# Patient Record
Sex: Male | Born: 1940 | Race: Black or African American | Hispanic: No | Marital: Married | Smoking: Never smoker
Health system: Southern US, Community
[De-identification: ages and names within clinical notes are randomized; demographics above are authoritative.]

## PROBLEM LIST (undated history)

## (undated) DIAGNOSIS — I10 Essential (primary) hypertension: Secondary | ICD-10-CM

## (undated) DIAGNOSIS — T753XXA Motion sickness, initial encounter: Secondary | ICD-10-CM

## (undated) DIAGNOSIS — Z972 Presence of dental prosthetic device (complete) (partial): Secondary | ICD-10-CM

## (undated) DIAGNOSIS — E785 Hyperlipidemia, unspecified: Secondary | ICD-10-CM

## (undated) DIAGNOSIS — H409 Unspecified glaucoma: Secondary | ICD-10-CM

## (undated) HISTORY — PX: EYE SURGERY: SHX253

---

## 2006-07-17 ENCOUNTER — Ambulatory Visit: Payer: Self-pay | Admitting: Unknown Physician Specialty

## 2009-04-13 ENCOUNTER — Ambulatory Visit (HOSPITAL_COMMUNITY): Admission: RE | Admit: 2009-04-13 | Discharge: 2009-04-13 | Payer: Self-pay | Admitting: Ophthalmology

## 2009-06-21 ENCOUNTER — Ambulatory Visit (HOSPITAL_COMMUNITY): Admission: RE | Admit: 2009-06-21 | Discharge: 2009-06-21 | Payer: Self-pay | Admitting: Ophthalmology

## 2009-10-13 ENCOUNTER — Ambulatory Visit: Payer: Self-pay | Admitting: Unknown Physician Specialty

## 2010-05-25 IMAGING — CR DG CHEST 2V
2 series · 2 of 2 positions shown · non-contrast
Comparison: None

CLINICAL DATA: Preop evaluation for a fall, surgery, hypertension .

CHEST - 2 VIEW

[view not recorded (1 of 2)]
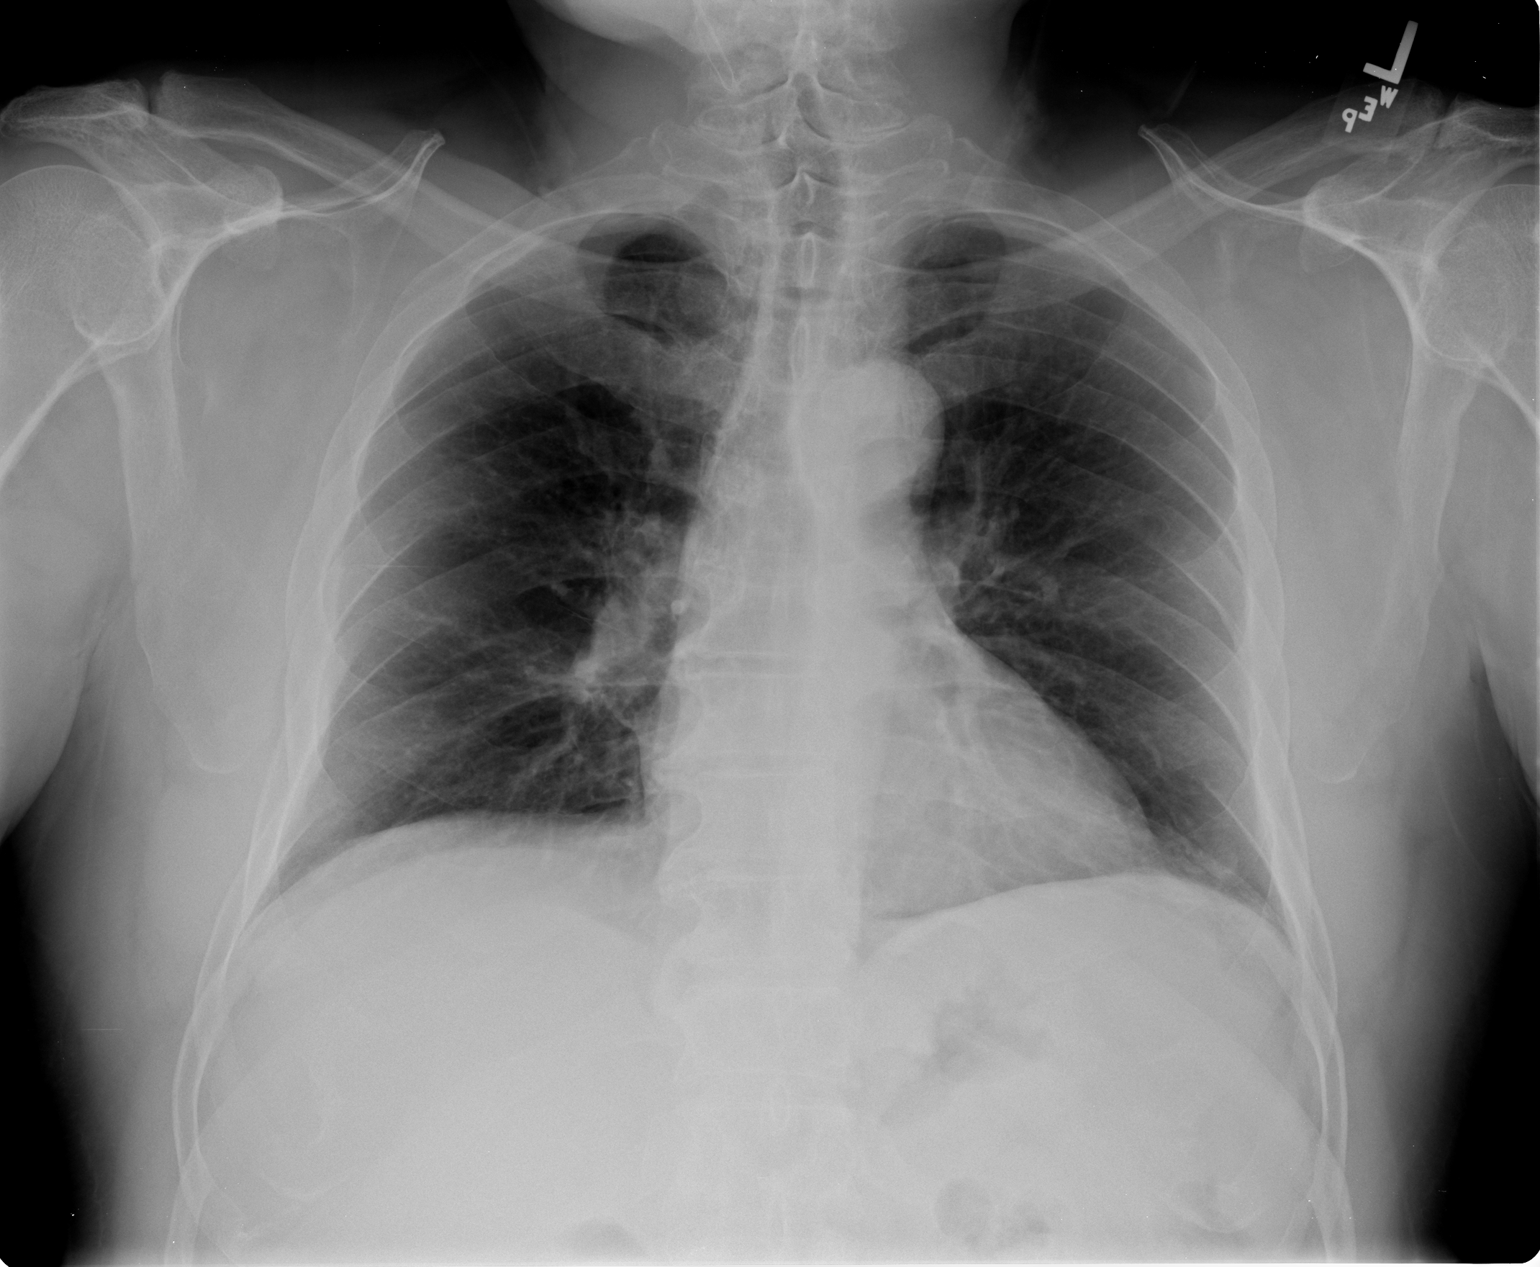

[view not recorded (2 of 2)]
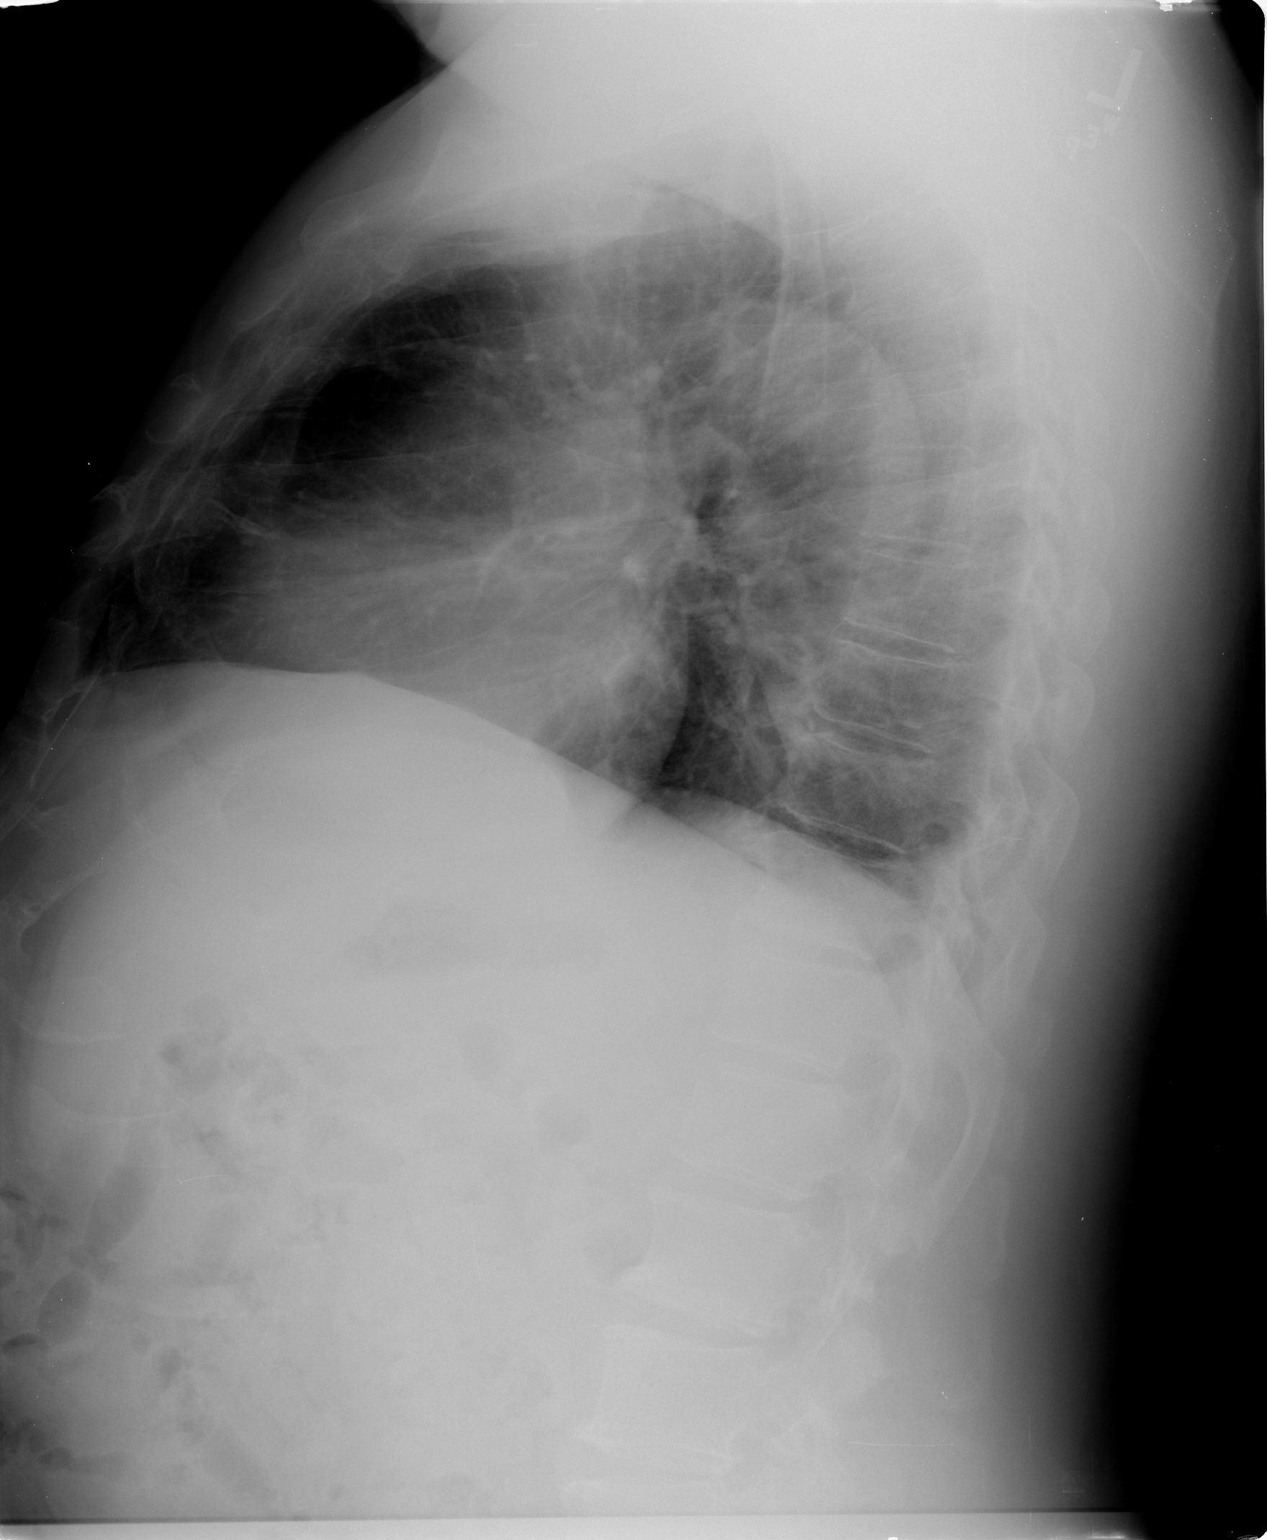

[2 of 2 positions shown; findings below may reference images not displayed]

FINDINGS: Low lung volumes with vascular crowding and basilar
atelectasis.  Negative for pneumonia, edema, effusion or
pneumothorax.  Degenerative thoracic spine.  Symmetric lower chest
nodular densities consistent with nipple shadows.
IMPRESSION: Low volume exam.  No active chest disease.

## 2010-08-21 LAB — BASIC METABOLIC PANEL
BUN: 9 mg/dL (ref 6–23)
CO2: 24 mEq/L (ref 19–32)
Creatinine, Ser: 1.19 mg/dL (ref 0.4–1.5)
GFR calc Af Amer: >60 mL/min (ref >60)
GFR calc non Af Amer: >60 mL/min (ref >60)
Glucose, Bld: 108 mg/dL — ABNORMAL HIGH (ref 70–99)
Sodium: 138 mEq/L (ref 135–145)

## 2010-08-21 LAB — CBC
MCV: 78.4 fL (ref 78.0–100.0)
WBC: 4.9 10*3/uL (ref 4.0–10.5)

## 2010-09-07 LAB — CBC
Hemoglobin: 14.2 g/dL (ref 13.0–17.0)
Platelets: 216 10*3/uL (ref 150–400)
RBC: 5.5 MIL/uL (ref 4.22–5.81)
WBC: 5.1 10*3/uL (ref 4.0–10.5)

## 2010-09-07 LAB — BASIC METABOLIC PANEL
BUN: 7 mg/dL (ref 6–23)
CO2: 26 mEq/L (ref 19–32)
Chloride: 105 mEq/L (ref 96–112)
GFR calc Af Amer: >60 mL/min (ref >60)
GFR calc non Af Amer: >60 mL/min (ref >60)
Glucose, Bld: 83 mg/dL (ref 70–99)
Potassium: 4.5 mEq/L (ref 3.5–5.1)
Sodium: 137 mEq/L (ref 135–145)

## 2012-09-02 ENCOUNTER — Ambulatory Visit: Payer: Self-pay | Admitting: Emergency Medicine

## 2012-09-02 LAB — CBC WITH DIFFERENTIAL/PLATELET
Basophil #: 0 10*3/uL (ref 0.0–0.1)
Basophil %: 0.4 %
Eosinophil #: 0 10*3/uL (ref 0.0–0.7)
Eosinophil %: 0 %
HCT: 52.9 % — ABNORMAL HIGH (ref 40.0–52.0)
HGB: 17.1 g/dL (ref 13.0–18.0)
Lymphocyte #: 0.2 10*3/uL — ABNORMAL LOW (ref 1.0–3.6)
Lymphocyte %: 1.9 %
MCH: 25.1 pg — ABNORMAL LOW (ref 26.0–34.0)
MCHC: 32.3 g/dL (ref 32.0–36.0)
MCV: 78 fL — ABNORMAL LOW (ref 80–100)
Monocyte #: 0.4 x10 3/mm (ref 0.2–1.0)
Monocyte %: 3.3 %
Neutrophil #: 10.5 10*3/uL — ABNORMAL HIGH (ref 1.4–6.5)
Neutrophil %: 94.4 %
Platelet: 249 10*3/uL (ref 150–440)
RBC: 6.82 10*6/uL — ABNORMAL HIGH (ref 4.40–5.90)
RDW: 15.6 % — ABNORMAL HIGH (ref 11.5–14.5)
WBC: 11.1 10*3/uL — ABNORMAL HIGH (ref 3.8–10.6)

## 2012-09-02 LAB — COMPREHENSIVE METABOLIC PANEL
Albumin: 4.4 g/dL (ref 3.4–5.0)
Alkaline Phosphatase: 91 U/L (ref 50–136)
Anion Gap: 13 (ref 7–16)
BUN: 25 mg/dL — ABNORMAL HIGH (ref 7–18)
Bilirubin,Total: 0.8 mg/dL (ref 0.2–1.0)
Calcium, Total: 10.5 mg/dL — ABNORMAL HIGH (ref 8.5–10.1)
Chloride: 104 mmol/L (ref 98–107)
Co2: 24 mmol/L (ref 21–32)
Creatinine: 3.09 mg/dL — ABNORMAL HIGH (ref 0.60–1.30)
EGFR (African American): 22 — ABNORMAL LOW
EGFR (Non-African Amer.): 19 — ABNORMAL LOW
Glucose: 142 mg/dL — ABNORMAL HIGH (ref 65–99)
Osmolality: 288 (ref 275–301)
Potassium: 4.8 mmol/L (ref 3.5–5.1)
SGOT(AST): 21 U/L (ref 15–37)
SGPT (ALT): 27 U/L (ref 12–78)
Sodium: 141 mmol/L (ref 136–145)
Total Protein: 10.1 g/dL — ABNORMAL HIGH (ref 6.4–8.2)

## 2013-05-13 ENCOUNTER — Ambulatory Visit: Payer: Self-pay | Admitting: Family Medicine

## 2013-11-10 ENCOUNTER — Ambulatory Visit: Payer: Self-pay | Admitting: Unknown Physician Specialty

## 2018-01-26 ENCOUNTER — Ambulatory Visit
Admission: EM | Admit: 2018-01-26 | Discharge: 2018-01-26 | Disposition: A | Discharge status: Home / Self Care | Payer: PRIVATE HEALTH INSURANCE | Attending: Family Medicine | Admitting: Family Medicine

## 2018-01-26 ENCOUNTER — Other Ambulatory Visit: Payer: Self-pay

## 2018-01-26 DIAGNOSIS — J029 Acute pharyngitis, unspecified: Secondary | ICD-10-CM | POA: Diagnosis not present

## 2018-01-26 HISTORY — DX: Essential (primary) hypertension: I10

## 2018-01-26 LAB — RAPID STREP SCREEN (MED CTR MEBANE ONLY): Streptococcus, Group A Screen (Direct): NEGATIVE

## 2018-01-26 MED ORDER — LIDOCAINE VISCOUS HCL 2 % MT SOLN: OROMUCOSAL | 0 refills | Status: AC

## 2018-01-26 NOTE — Discharge Instructions (Signed)
This is viral.  Tylenol as needed.  Use the viscous lidocaine as directed.   Take care  Dr. Lacinda Axon

## 2018-01-26 NOTE — ED Provider Notes (Signed)
[facility]    CSN: [id number] Arrival date & time: 01/26/18  0820  History   Chief Complaint Chief Complaint  Patient presents with  . Sore Throat   HPI   77 year old male presents with sore throat.  Sore throat started yesterday.  Patient denies any other upper respiratory symptoms.  Pain is mild to moderate, currently 5/10 in severity.  No known exacerbating factors. He has used some salt water gargles without improvement.  No other medications or interventions tried.  No reported sick contacts.  No other associated symptoms.  No other claims.   PMH, Surgical Hx, Family Hx, Social History reviewed and updated as below.  Past Medical History:  Diagnosis Date  . Hypertension    Past Surgical History:  Procedure Laterality Date  . EYE SURGERY     Home Medications    Prior to Admission medications   Medication Sig Start Date End Date Taking? Authorizing Provider  amLODipine (NORVASC) 10 MG tablet Take by mouth. 11/05/17  Yes [provider]  lisinopril (PRINIVIL,ZESTRIL) 10 MG tablet Take by mouth. 11/05/17  Yes [provider]  aspirin EC 81 MG tablet Take by mouth.    [provider]  Cyanocobalamin (VITAMIN B-12) 1000 MCG SUBL Take by mouth.    [provider]  lidocaine (XYLOCAINE) 2 % solution Gargle 15 mL every 3 hours as needed. May swallow if desired. 01/26/18   Coral Spikes, DO    Family History Diabetes type II Brother    Myocardial Infarction (Heart attack) Father    Diabetes Mother    High blood pressure (Hypertension) Mother    Myocardial Infarction (Heart attack) Mother    Diabetes type II Sister     Social History Social History   Tobacco Use  . Smoking status: Never Smoker  . Smokeless tobacco: Never Used  Substance Use Topics  . Alcohol use: Not Currently  . Drug use: Not Currently   Allergies   Patient has no known allergies.   Review of Systems Review of Systems  Constitutional:  Negative.   HENT: Positive for sore throat.    Physical Exam Triage Vital Signs ED Triage Vitals  Enc Vitals Group     BP 01/26/18 0837 137/89     Pulse Rate 01/26/18 0837 69     Resp 01/26/18 0837 18     Temp 01/26/18 0837 98.4 F (36.9 C)     Temp Source 01/26/18 0837 Oral     SpO2 01/26/18 0837 99 %     Weight 01/26/18 0833 223 lb (101.2 kg)     Height 01/26/18 0833 5\' 9"  (1.753 m)     Head Circumference --      Peak Flow --      Pain Score 01/26/18 0833 5     Pain Loc --      Pain Edu? --      Excl. in [place]? --    Updated Vital Signs BP 137/89 (BP Location: Right Arm)   Pulse 69   Temp 98.4 F (36.9 C) (Oral)   Resp 18   Ht 5\' 9"  (1.753 m)   Wt 101.2 kg   SpO2 99%   BMI 32.93 kg/m    Visual Acuity Right Eye Distance:   Left Eye Distance:   Bilateral Distance:    Right Eye Near:   Left Eye Near:    Bilateral Near:     Physical Exam  Constitutional: He is oriented to person, place, and time.  He appears well-developed. No distress.  HENT:  Head: Normocephalic and atraumatic.  Oropharynx with mild to moderate erythema.  No exudate.  Cardiovascular: Normal rate and regular rhythm.  Pulmonary/Chest: Effort normal and breath sounds normal. He has no wheezes. He has no rales.  Neurological: He is alert and oriented to person, place, and time.  Psychiatric: He has a normal mood and affect. His behavior is normal.  Nursing note and vitals reviewed.  UC Treatments / Results  Labs (all labs ordered are listed, but only abnormal results are displayed) Labs Reviewed  RAPID STREP SCREEN ([facility] ONLY)  CULTURE, GROUP A STREP [facility])   EKG None  Radiology No results found.  Procedures Procedures (including critical care time)  Medications Ordered in UC Medications - No data to display  Initial Impression / Assessment and Plan / UC Course  I have reviewed the triage vital signs and the nursing notes.  Pertinent labs & imaging results that were  available during my care of the patient were reviewed by me and considered in my medical decision making (see chart for details).    77 year old male presents with sore throat.  Rapid strep negative.  Likely viral in origin.  Viscous lidocaine as needed.  Tylenol as needed.  Supportive care.  Final Clinical Impressions(s) / UC Diagnoses   Final diagnoses:  Viral pharyngitis     Discharge Instructions     This is viral.  Tylenol as needed.  Use the viscous lidocaine as directed.   Take care  Dr. Lacinda Axon    ED Prescriptions    Medication Sig Dispense Auth. Provider   lidocaine (XYLOCAINE) 2 % solution Gargle 15 mL every 3 hours as needed. May swallow if desired. 200 mL Coral Spikes, DO     Controlled Substance Prescriptions [place] Controlled Substance Registry consulted? Not Applicable  Coral Spikes, [place] 01/26/18 0350

## 2018-01-26 NOTE — ED Triage Notes (Signed)
Pt with sore throat starting yesterday. Denies other sx. Pain 5/10

## 2018-01-29 LAB — CULTURE, GROUP A STREP (THRC)

## 2018-09-23 ENCOUNTER — Encounter: Admission: RE | Payer: Self-pay | Source: Home / Self Care

## 2018-09-23 ENCOUNTER — Ambulatory Visit: Admission: RE | Admit: 2018-09-23 | Payer: Medicare Other | Source: Home / Self Care | Admitting: Ophthalmology

## 2018-09-23 SURGERY — PHACOEMULSIFICATION, CATARACT, WITH IOL INSERTION
Anesthesia: Topical | Laterality: Right

## 2018-11-04 ENCOUNTER — Other Ambulatory Visit: Payer: Self-pay

## 2018-11-04 ENCOUNTER — Encounter: Payer: Self-pay | Admitting: *Deleted

## 2018-11-07 ENCOUNTER — Other Ambulatory Visit: Payer: Self-pay

## 2018-11-07 ENCOUNTER — Other Ambulatory Visit
Admission: RE | Admit: 2018-11-07 | Discharge: 2018-11-07 | Disposition: A | Discharge status: Home / Self Care | Payer: Medicare Other | Source: Ambulatory Visit | Attending: Ophthalmology | Admitting: Ophthalmology

## 2018-11-07 DIAGNOSIS — Z1159 Encounter for screening for other viral diseases: Secondary | ICD-10-CM | POA: Diagnosis present

## 2018-11-08 LAB — NOVEL CORONAVIRUS, NAA (HOSP ORDER, SEND-OUT TO REF LAB; TAT 18-24 HRS): SARS-CoV-2, NAA: NOT DETECTED

## 2018-11-08 NOTE — Discharge Instructions (Signed)

## 2018-11-11 ENCOUNTER — Ambulatory Visit: Payer: Medicare Other | Admitting: Anesthesiology

## 2018-11-11 ENCOUNTER — Ambulatory Visit
Admission: RE | Admit: 2018-11-11 | Discharge: 2018-11-11 | Disposition: A | Discharge status: Home / Self Care | Payer: Medicare Other | Attending: Ophthalmology | Admitting: Ophthalmology

## 2018-11-11 ENCOUNTER — Encounter
Admission: RE | Disposition: A | Discharge status: Home / Self Care | Payer: Self-pay | Source: Home / Self Care | Attending: Ophthalmology

## 2018-11-11 DIAGNOSIS — I10 Essential (primary) hypertension: Secondary | ICD-10-CM | POA: Diagnosis not present

## 2018-11-11 DIAGNOSIS — H2511 Age-related nuclear cataract, right eye: Secondary | ICD-10-CM | POA: Diagnosis not present

## 2018-11-11 DIAGNOSIS — Z79899 Other long term (current) drug therapy: Secondary | ICD-10-CM | POA: Diagnosis not present

## 2018-11-11 DIAGNOSIS — Z7982 Long term (current) use of aspirin: Secondary | ICD-10-CM | POA: Insufficient documentation

## 2018-11-11 HISTORY — DX: Presence of dental prosthetic device (complete) (partial): Z97.2

## 2018-11-11 HISTORY — DX: Motion sickness, initial encounter: T75.3XXA

## 2018-11-11 HISTORY — PX: CATARACT EXTRACTION W/PHACO: SHX586

## 2018-11-11 SURGERY — PHACOEMULSIFICATION, CATARACT, WITH IOL INSERTION
Anesthesia: General | Site: Eye | Laterality: Right

## 2018-11-11 MED ORDER — MIDAZOLAM HCL 2 MG/2ML IJ SOLN
INTRAMUSCULAR | Status: DC | PRN
  Administered 2018-11-11: 2 mg via INTRAVENOUS

## 2018-11-11 MED ORDER — LIDOCAINE HCL (PF) 2 % IJ SOLN
INTRAOCULAR | Status: DC | PRN
  Administered 2018-11-11: 12:00:00 1 mL via INTRAOCULAR

## 2018-11-11 MED ORDER — MOXIFLOXACIN HCL 0.5 % OP SOLN
OPHTHALMIC | Status: DC | PRN
  Administered 2018-11-11: 0.2 mL via OPHTHALMIC

## 2018-11-11 MED ORDER — DIFLUPREDNATE 0.05 % OP EMUL: 1.0000 [drp] | Freq: Four times a day (QID) | OPHTHALMIC | 0 refills | Status: AC

## 2018-11-11 MED ORDER — LACTATED RINGERS IV SOLN: 10.0000 mL/h | INTRAVENOUS | Status: DC

## 2018-11-11 MED ORDER — TETRACAINE HCL 0.5 % OP SOLN
1.0000 [drp] | OPHTHALMIC | Status: DC | PRN
  Administered 2018-11-11 (×3): 1 [drp] via OPHTHALMIC

## 2018-11-11 MED ORDER — ARMC OPHTHALMIC DILATING DROPS
1.0000 "application " | OPHTHALMIC | Status: DC | PRN
  Administered 2018-11-11 (×3): 1 via OPHTHALMIC

## 2018-11-11 MED ORDER — EPINEPHRINE PF 1 MG/ML IJ SOLN
INTRAOCULAR | Status: DC | PRN
  Administered 2018-11-11: 79 mL via OPHTHALMIC

## 2018-11-11 MED ORDER — SODIUM HYALURONATE 10 MG/ML IO SOLN
INTRAOCULAR | Status: DC | PRN
  Administered 2018-11-11: 0.55 mL via INTRAOCULAR

## 2018-11-11 MED ORDER — SODIUM HYALURONATE 23 MG/ML IO SOLN
INTRAOCULAR | Status: DC | PRN
  Administered 2018-11-11: 0.6 mL via INTRAOCULAR

## 2018-11-11 MED ORDER — ONDANSETRON HCL 4 MG/2ML IJ SOLN: 4.0000 mg | Freq: Once | INTRAMUSCULAR | Status: DC | PRN

## 2018-11-11 MED ORDER — FENTANYL CITRATE (PF) 100 MCG/2ML IJ SOLN
INTRAMUSCULAR | Status: DC | PRN
  Administered 2018-11-11: 50 ug via INTRAVENOUS

## 2018-11-11 SURGICAL SUPPLY — 17 items
CANNULA ANT/CHMB 27GA (MISCELLANEOUS) ×4 IMPLANT
DISSECTOR HYDRO NUCLEUS 50X22 (MISCELLANEOUS) ×2 IMPLANT
GLOVE SURG LX 7.5 STRW (GLOVE) ×2
GLOVE SURG LX STRL 7.5 STRW (GLOVE) ×2 IMPLANT
GLOVE SURG SYN 8.5  E (GLOVE) ×1
GLOVE SURG SYN 8.5 E (GLOVE) ×1 IMPLANT
GOWN STRL REUS W/ TWL LRG LVL3 (GOWN DISPOSABLE) ×2 IMPLANT
GOWN STRL REUS W/TWL LRG LVL3 (GOWN DISPOSABLE) ×2
LENS IOL TECNIS ITEC 17.5 (Intraocular Lens) ×2 IMPLANT
MARKER SKIN DUAL TIP RULER LAB (MISCELLANEOUS) ×2 IMPLANT
PACK DR. KING ARMS (PACKS) ×2 IMPLANT
PACK EYE AFTER SURG (MISCELLANEOUS) ×2 IMPLANT
PACK OPTHALMIC (MISCELLANEOUS) ×2 IMPLANT
SYR 3ML LL SCALE MARK (SYRINGE) ×2 IMPLANT
SYR TB 1ML LUER SLIP (SYRINGE) ×2 IMPLANT
WATER STERILE IRR 250ML POUR (IV SOLUTION) ×2 IMPLANT
WIPE NON LINTING 3.25X3.25 (MISCELLANEOUS) ×2 IMPLANT

## 2018-11-11 NOTE — Anesthesia Postprocedure Evaluation (Signed)
Anesthesia Post Note  Patient: Rickey Mitchell  Procedure(s) Performed: CATARACT EXTRACTION PHACO AND INTRAOCULAR LENS PLACEMENT (IOC)  RIGHT (Right Eye)  Patient location during evaluation: PACU Anesthesia Type: MAC Level of consciousness: awake and alert Pain management: pain level controlled Vital Signs Assessment: post-procedure vital signs reviewed and stable Respiratory status: spontaneous breathing, nonlabored ventilation, respiratory function stable and patient connected to nasal cannula oxygen Cardiovascular status: stable and blood pressure returned to baseline Postop Assessment: no apparent nausea or vomiting Anesthetic complications: no    SCOURAS, NICOLE ELAINE

## 2018-11-11 NOTE — Op Note (Signed)
OPERATIVE NOTE  Rickey Mitchell [id number] 11/11/2018   PREOPERATIVE DIAGNOSIS:  Nuclear sclerotic cataract right eye.  H25.11   POSTOPERATIVE DIAGNOSIS:    Nuclear sclerotic cataract right eye.     PROCEDURE:  Phacoemusification with posterior chamber intraocular lens placement of the right eye   LENS:   Implant Name Type Inv. Item Serial No. Manufacturer Lot No. LRB No. Used  LENS IOL DIOP 17.5 - [id number] Intraocular Lens LENS IOL DIOP 17.5 [id number] AMO  Right 1       PCB00 +17.5   ULTRASOUND TIME: 1 minutes 13 seconds.  CDE 7.14   SURGEON:  Benay Pillow, MD, MPH  ANESTHESIOLOGIST: Anesthesiologist: Marice Potter, MD CRNA: Janna Arch, CRNA   ANESTHESIA:  Topical with tetracaine drops augmented with 1% preservative-free intracameral lidocaine.  ESTIMATED BLOOD LOSS: less than 1 mL.   COMPLICATIONS:  None.   DESCRIPTION OF PROCEDURE:  The patient was identified in the holding room and transported to the operating room and placed in the supine position under the operating microscope.  The right eye was identified as the operative eye and it was prepped and draped in the usual sterile ophthalmic fashion.   A 1.0 millimeter clear-corneal paracentesis was made at the 10:30 position. 0.5 ml of preservative-free 1% lidocaine with epinephrine was injected into the anterior chamber.  The anterior chamber was filled with Healon 5 viscoelastic.  A 2.4 millimeter keratome was used to make a near-clear corneal incision at the 8:00 position.  A curvilinear capsulorrhexis was made with a cystotome and capsulorrhexis forceps.  Balanced salt solution was used to hydrodissect and hydrodelineate the nucleus.   Phacoemulsification was then used in stop and chop fashion to remove the lens nucleus and epinucleus.  The remaining cortex was then removed using the irrigation and aspiration handpiece. Healon was then placed into the capsular bag to distend it for lens placement.  A  lens was then injected into the capsular bag.  The remaining viscoelastic was aspirated.   Wounds were hydrated with balanced salt solution.  The anterior chamber was inflated to a physiologic pressure with balanced salt solution.   Intracameral vigamox 0.1 mL undiluted was injected into the eye and a drop placed onto the ocular surface.  No wound leaks were noted.  The patient was taken to the recovery room in stable condition without complications of anesthesia or surgery  Benay Pillow 11/11/2018, 12:20 PM

## 2018-11-11 NOTE — Transfer of Care (Signed)
Immediate Anesthesia Transfer of Care Note  Patient: Rickey Mitchell  Procedure(s) Performed: CATARACT EXTRACTION PHACO AND INTRAOCULAR LENS PLACEMENT (IOC)  RIGHT (Right Eye)  Patient Location: PACU  Anesthesia Type: General  Level of Consciousness: awake, alert  and patient cooperative  Airway and Oxygen Therapy: Patient Spontanous Breathing and Patient connected to supplemental oxygen  Post-op Assessment: Post-op Vital signs reviewed, Patient's Cardiovascular Status Stable, Respiratory Function Stable, Patent Airway and No signs of Nausea or vomiting  Post-op Vital Signs: Reviewed and stable  Complications: No apparent anesthesia complications

## 2018-11-11 NOTE — H&P (Signed)

## 2018-11-11 NOTE — Anesthesia Preprocedure Evaluation (Addendum)
Anesthesia Evaluation  Patient identified by MRN, date of birth, ID band Patient awake    Reviewed: Allergy & Precautions, H&P , NPO status , Patient's Chart, lab work & pertinent test results, reviewed documented beta blocker date and time   Airway Mallampati: II  TM Distance: >3 FB Neck ROM: full    Dental no notable dental hx. (+) Upper Dentures, Lower Dentures   Pulmonary neg pulmonary ROS,    Pulmonary exam normal breath sounds clear to auscultation       Cardiovascular Exercise Tolerance: Good hypertension,  Rhythm:regular Rate:Normal     Neuro/Psych negative neurological ROS  negative psych ROS   GI/Hepatic negative GI ROS, Neg liver ROS,   Endo/Other  negative endocrine ROS  Renal/GU negative Renal ROS  negative genitourinary   Musculoskeletal   Abdominal   Peds  Hematology negative hematology ROS (+)   Anesthesia Other Findings   Reproductive/Obstetrics negative OB ROS                            Anesthesia Physical Anesthesia Plan  ASA: II  Anesthesia Plan: MAC   Post-op Pain Management:    Induction:   PONV Risk Score and Plan:   Airway Management Planned:   Additional Equipment:   Intra-op Plan:   Post-operative Plan:   Informed Consent: I have reviewed the patients History and Physical, chart, labs and discussed the procedure including the risks, benefits and alternatives for the proposed anesthesia with the patient or authorized representative who has indicated his/her understanding and acceptance.     Dental Advisory Given  Plan Discussed with: CRNA  Anesthesia Plan Comments:        Anesthesia Quick Evaluation

## 2018-11-11 NOTE — Anesthesia Procedure Notes (Signed)
Procedure Name: MAC Date/Time: 11/11/2018 11:57 AM Performed by: Janna Arch, CRNA Pre-anesthesia Checklist: Patient identified, Emergency Drugs available, Suction available, Timeout performed and Patient being monitored Patient Re-evaluated:Patient Re-evaluated prior to induction Oxygen Delivery Method: Nasal cannula Placement Confirmation: positive ETCO2

## 2018-11-12 ENCOUNTER — Encounter: Payer: Self-pay | Admitting: Ophthalmology

## 2019-03-31 ENCOUNTER — Other Ambulatory Visit: Payer: Self-pay

## 2019-03-31 DIAGNOSIS — N400 Enlarged prostate without lower urinary tract symptoms: Secondary | ICD-10-CM

## 2019-04-01 ENCOUNTER — Other Ambulatory Visit
Admission: RE | Admit: 2019-04-01 | Discharge: 2019-04-01 | Disposition: A | Discharge status: Home / Self Care | Payer: Medicare Other | Attending: Urology | Admitting: Urology

## 2019-04-01 ENCOUNTER — Encounter: Payer: Self-pay | Admitting: Urology

## 2019-04-01 ENCOUNTER — Ambulatory Visit (INDEPENDENT_AMBULATORY_CARE_PROVIDER_SITE_OTHER): Payer: Medicare Other | Admitting: Urology

## 2019-04-01 ENCOUNTER — Other Ambulatory Visit: Payer: Self-pay

## 2019-04-01 DIAGNOSIS — N400 Enlarged prostate without lower urinary tract symptoms: Secondary | ICD-10-CM | POA: Diagnosis present

## 2019-04-01 LAB — URINALYSIS, COMPLETE (UACMP) WITH MICROSCOPIC
Bacteria, UA: NONE SEEN
Glucose, UA: NEGATIVE mg/dL
Hgb urine dipstick: NEGATIVE
Leukocytes,Ua: NEGATIVE
Nitrite: NEGATIVE
Protein, ur: NEGATIVE mg/dL
Specific Gravity, Urine: 1.025 (ref 1.005–1.030)
Squamous Epithelial / LPF: NONE SEEN (ref 0–5)
pH: 5.5 (ref 5.0–8.0)

## 2019-04-01 NOTE — Progress Notes (Signed)
C/x appt seeing another Urologist

## 2019-04-04 ENCOUNTER — Ambulatory Visit: Payer: Self-pay | Admitting: Urology

## 2020-08-27 ENCOUNTER — Other Ambulatory Visit
Admission: RE | Admit: 2020-08-27 | Discharge: 2020-08-27 | Disposition: A | Discharge status: Home / Self Care | Payer: Medicare Other | Source: Ambulatory Visit | Attending: Gastroenterology | Admitting: Gastroenterology

## 2020-08-27 ENCOUNTER — Other Ambulatory Visit: Payer: Self-pay

## 2020-08-27 DIAGNOSIS — Z20822 Contact with and (suspected) exposure to covid-19: Secondary | ICD-10-CM | POA: Insufficient documentation

## 2020-08-27 DIAGNOSIS — Z01812 Encounter for preprocedural laboratory examination: Secondary | ICD-10-CM | POA: Diagnosis present

## 2020-08-28 LAB — SARS CORONAVIRUS 2 (TAT 6-24 HRS): SARS Coronavirus 2: NEGATIVE

## 2020-08-30 ENCOUNTER — Encounter: Payer: Self-pay | Admitting: *Deleted

## 2020-08-31 ENCOUNTER — Other Ambulatory Visit: Payer: Self-pay

## 2020-08-31 ENCOUNTER — Encounter
Admission: RE | Disposition: A | Discharge status: Home / Self Care | Payer: Self-pay | Source: Home / Self Care | Attending: Gastroenterology

## 2020-08-31 ENCOUNTER — Ambulatory Visit: Payer: Medicare Other | Admitting: Anesthesiology

## 2020-08-31 ENCOUNTER — Ambulatory Visit
Admission: RE | Admit: 2020-08-31 | Discharge: 2020-08-31 | Disposition: A | Discharge status: Home / Self Care | Payer: Medicare Other | Attending: Gastroenterology | Admitting: Gastroenterology

## 2020-08-31 ENCOUNTER — Encounter: Payer: Self-pay | Admitting: *Deleted

## 2020-08-31 DIAGNOSIS — Z1211 Encounter for screening for malignant neoplasm of colon: Secondary | ICD-10-CM | POA: Diagnosis not present

## 2020-08-31 DIAGNOSIS — D128 Benign neoplasm of rectum: Secondary | ICD-10-CM | POA: Diagnosis not present

## 2020-08-31 DIAGNOSIS — K5730 Diverticulosis of large intestine without perforation or abscess without bleeding: Secondary | ICD-10-CM | POA: Insufficient documentation

## 2020-08-31 DIAGNOSIS — Z7982 Long term (current) use of aspirin: Secondary | ICD-10-CM | POA: Insufficient documentation

## 2020-08-31 DIAGNOSIS — Z86010 Personal history of colonic polyps: Secondary | ICD-10-CM | POA: Diagnosis not present

## 2020-08-31 DIAGNOSIS — Z79899 Other long term (current) drug therapy: Secondary | ICD-10-CM | POA: Diagnosis not present

## 2020-08-31 HISTORY — PX: COLONOSCOPY: SHX5424

## 2020-08-31 HISTORY — DX: Unspecified glaucoma: H40.9

## 2020-08-31 HISTORY — DX: Hyperlipidemia, unspecified: E78.5

## 2020-08-31 SURGERY — COLONOSCOPY
Anesthesia: General

## 2020-08-31 MED ORDER — SODIUM CHLORIDE 0.9 % IV SOLN: INTRAVENOUS | Status: DC

## 2020-08-31 MED ORDER — SODIUM CHLORIDE 0.9 % IV SOLN: INTRAVENOUS | Status: DC | PRN

## 2020-08-31 MED ORDER — PROPOFOL 500 MG/50ML IV EMUL
INTRAVENOUS | Status: DC | PRN
  Administered 2020-08-31: 200 ug/kg/min via INTRAVENOUS

## 2020-08-31 MED ORDER — PHENYLEPHRINE HCL (PRESSORS) 10 MG/ML IV SOLN
INTRAVENOUS | Status: DC | PRN
  Administered 2020-08-31: 200 ug via INTRAVENOUS

## 2020-08-31 MED ORDER — PROPOFOL 10 MG/ML IV BOLUS
INTRAVENOUS | Status: DC | PRN
  Administered 2020-08-31: 60 mg via INTRAVENOUS

## 2020-08-31 NOTE — Anesthesia Preprocedure Evaluation (Signed)
Anesthesia Evaluation  Patient identified by MRN, date of birth, ID band Patient awake    Reviewed: Allergy & Precautions, H&P , NPO status , Patient's Chart, lab work & pertinent test results, reviewed documented beta blocker date and time   Airway Mallampati: II   Neck ROM: full    Dental  (+) Teeth Intact   Pulmonary neg pulmonary ROS,    Pulmonary exam normal        Cardiovascular Exercise Tolerance: Poor hypertension, On Medications negative cardio ROS Normal cardiovascular exam Rhythm:regular Rate:Normal     Neuro/Psych negative neurological ROS  negative psych ROS   GI/Hepatic negative GI ROS, Neg liver ROS,   Endo/Other  negative endocrine ROS  Renal/GU negative Renal ROS  negative genitourinary   Musculoskeletal   Abdominal   Peds  Hematology negative hematology ROS (+)   Anesthesia Other Findings Past Medical History: No date: Glaucoma No date: Hyperlipidemia No date: Hypertension No date: Motion sickness     Comment:  deep sea fishing No date: Wears dentures     Comment:  partial upper and lower.  has, doesn't wear Past Surgical History: 11/11/2018: CATARACT EXTRACTION W/PHACO; Right     Comment:  Procedure: CATARACT EXTRACTION PHACO AND INTRAOCULAR               LENS PLACEMENT (IOC)  RIGHT;  Surgeon: Eulogio Bear, MD;  Location: [facility];  Service:               Ophthalmology;  Laterality: Right; No date: EYE SURGERY   Reproductive/Obstetrics negative OB ROS                             Anesthesia Physical Anesthesia Plan  ASA: II  Anesthesia Plan: General   Post-op Pain Management:    Induction:   PONV Risk Score and Plan:   Airway Management Planned:   Additional Equipment:   Intra-op Plan:   Post-operative Plan:   Informed Consent: I have reviewed the patients History and Physical, chart, labs and discussed the  procedure including the risks, benefits and alternatives for the proposed anesthesia with the patient or authorized representative who has indicated his/her understanding and acceptance.     Dental Advisory Given  Plan Discussed with: CRNA  Anesthesia Plan Comments:         Anesthesia Quick Evaluation

## 2020-08-31 NOTE — H&P (Signed)
Outpatient short stay form Pre-procedure 08/31/2020 9:01 AM Raylene Miyamoto MD, MPH  Primary Physician: [facility]  Reason for visit:  Surveillance  History of present illness:   80 y/o gentleman with history of hypertension here for surveillance colonoscopy. No family history of GI malignancies. No blood thinners. Hx of bladder removal. No significant GI symptoms.    Current Facility-Administered Medications:  .  0.9 %  sodium chloride infusion, , Intravenous, Continuous, Chelsey Kimberley, Hilton Cork, MD  Medications Prior to Admission  Medication Sig Dispense Refill Last Dose  . amLODipine (NORVASC) 10 MG tablet Take by mouth.   08/31/2020 at Unknown time  . aspirin EC 81 MG tablet Take by mouth.   Past Week at Unknown time  . Cyanocobalamin (VITAMIN B-12) 1000 MCG SUBL Take by mouth.   Past Week at Unknown time  . dorzolamide-timolol (COSOPT) 22.3-6.8 MG/ML ophthalmic solution 1 drop 2 (two) times daily.   08/31/2020 at Unknown time  . latanoprost (XALATAN) 0.005 % ophthalmic solution 1 drop at bedtime.   08/31/2020 at Unknown time  . lisinopril (PRINIVIL,ZESTRIL) 10 MG tablet Take by mouth.   08/31/2020 at Unknown time  . omeprazole (PRILOSEC) 20 MG capsule Take 20 mg by mouth daily.   Past Week at Unknown time  . rosuvastatin (CRESTOR) 5 MG tablet Take by mouth.   Past Week at Unknown time  . lidocaine (XYLOCAINE) 2 % solution Gargle 15 mL every 3 hours as needed. May swallow if desired. 200 mL 0      No Known Allergies   Past Medical History:  Diagnosis Date  . Glaucoma   . Hyperlipidemia   . Hypertension   . Motion sickness    deep sea fishing  . Wears dentures    partial upper and lower.  has, doesn't wear    Review of systems:  Otherwise negative.    Physical Exam  Gen: Alert, oriented. Appears stated age.  HEENT: PERRLA. Lungs: No respiratory distress CV: RRR Abd: soft, benign, no masses Ext: No edema    Planned procedures: Proceed with colonoscopy.  The patient understands the nature of the planned procedure, indications, risks, alternatives and potential complications including but not limited to bleeding, infection, perforation, damage to internal organs and possible oversedation/side effects from anesthesia. The patient agrees and gives consent to proceed.  Please refer to procedure notes for findings, recommendations and patient disposition/instructions.     Raylene Miyamoto MD, MPH Gastroenterology 08/31/2020  9:01 AM

## 2020-08-31 NOTE — Anesthesia Postprocedure Evaluation (Signed)
Anesthesia Post Note  Patient: Rickey Mitchell  Procedure(s) Performed: COLONOSCOPY (N/A )  Patient location during evaluation: PACU Anesthesia Type: General Level of consciousness: awake and alert Pain management: pain level controlled Vital Signs Assessment: post-procedure vital signs reviewed and stable Respiratory status: spontaneous breathing, nonlabored ventilation, respiratory function stable and patient connected to nasal cannula oxygen Cardiovascular status: blood pressure returned to baseline and stable Postop Assessment: no apparent nausea or vomiting Anesthetic complications: no   No complications documented.   Last Vitals:  Vitals:   08/31/20 0953 08/31/20 1003  BP: (!) 99/58 108/84  Pulse: 61 (!) 57  Resp: (!) 25 15  Temp:    SpO2: 100% 99%    Last Pain:  Vitals:   08/31/20 1003  TempSrc:   PainSc: 0-No pain                 Molli Barrows

## 2020-08-31 NOTE — Anesthesia Procedure Notes (Signed)
Date/Time: 08/31/2020 9:25 AM Performed by: Nelda Marseille, CRNA Pre-anesthesia Checklist: Patient identified, Emergency Drugs available, Suction available, Patient being monitored and Timeout performed Oxygen Delivery Method: Simple face mask

## 2020-08-31 NOTE — Transfer of Care (Signed)
Immediate Anesthesia Transfer of Care Note  Patient: Rickey Mitchell  Procedure(s) Performed: COLONOSCOPY (N/A )  Patient Location: PACU  Anesthesia Type:General  Level of Consciousness: sedated  Airway & Oxygen Therapy: Patient Spontanous Breathing and Patient connected to nasal cannula oxygen  Post-op Assessment: Report given to RN and Post -op Vital signs reviewed and stable  Post vital signs: Reviewed and stable  Last Vitals:  Vitals Value Taken Time  BP 80/40 08/31/20 0944  Temp 36.2 C 08/31/20 0943  Pulse 47 08/31/20 0946  Resp 13 08/31/20 0946  SpO2 98 % 08/31/20 0946  Vitals shown include unvalidated device data.  Last Pain:  Vitals:   08/31/20 0943  TempSrc: Temporal  PainSc: Asleep         Complications: No complications documented.

## 2020-08-31 NOTE — Interval H&P Note (Signed)
History and Physical Interval Note:  08/31/2020 9:03 AM  Rickey Mitchell  has presented today for surgery, with the diagnosis of PERSONAL HX.OF COLON POLYPS.  The various methods of treatment have been discussed with the patient and family. After consideration of risks, benefits and other options for treatment, the patient has consented to  Procedure(s): COLONOSCOPY (N/A) as a surgical intervention.  The patient's history has been reviewed, patient examined, no change in status, stable for surgery.  I have reviewed the patient's chart and labs.  Questions were answered to the patient's satisfaction.     Lesly Rubenstein  Ok to proceed with colonoscopy

## 2020-08-31 NOTE — Op Note (Signed)
[facility] Gastroenterology Patient Name: Rickey Mitchell Procedure Date: 08/31/2020 9:10 AM MRN: [id number] Account #: [id number] Date of Birth: [birth year] Admit Type: Outpatient Age: 80 Room: [facility] ENDO ROOM 1 Gender: Male Note Status: Finalized Procedure:             Colonoscopy Indications:           Surveillance: Personal history of adenomatous polyps                         on last colonoscopy > 5 years ago Providers:             Andrey Farmer MD, MD Referring MD:          No Local Md, MD (Referring MD) Medicines:             Monitored Anesthesia Care Complications:         No immediate complications. Estimated blood loss: None. Procedure:             Pre-Anesthesia Assessment:                        - Prior to the procedure, a History and Physical was                         performed, and patient medications and allergies were                         reviewed. The patient is competent. The risks and                         benefits of the procedure and the sedation options and                         risks were discussed with the patient. All questions                         were answered and informed consent was obtained.                         Patient identification and proposed procedure were                         verified by the physician, the nurse, the anesthetist                         and the technician in the endoscopy suite. Mental                         Status Examination: alert and oriented. Airway                         Examination: normal oropharyngeal airway and neck                         mobility. Respiratory Examination: clear to                         auscultation. CV Examination: normal. Prophylactic  Antibiotics: The patient does not require prophylactic                         antibiotics. Prior Anticoagulants: The patient has                         taken no previous anticoagulant or antiplatelet                          agents. ASA Grade Assessment: III - A patient with                         severe systemic disease. After reviewing the risks and                         benefits, the patient was deemed in satisfactory                         condition to undergo the procedure. The anesthesia                         plan was to use monitored anesthesia care (MAC).                         Immediately prior to administration of medications,                         the patient was re-assessed for adequacy to receive                         sedatives. The heart rate, respiratory rate, oxygen                         saturations, blood pressure, adequacy of pulmonary                         ventilation, and response to care were monitored                         throughout the procedure. The physical status of the                         patient was re-assessed after the procedure.                        After obtaining informed consent, the colonoscope was                         passed under direct vision. Throughout the procedure,                         the patient's blood pressure, pulse, and oxygen                         saturations were monitored continuously. The                         Colonoscope was introduced through the anus and  advanced to the the cecum, identified by appendiceal                         orifice and ileocecal valve. The colonoscopy was                         performed without difficulty. The patient tolerated                         the procedure well. The quality of the bowel                         preparation was good. Findings:      The perianal and digital rectal examinations were normal.      Scattered small-mouthed diverticula were found in the sigmoid colon,       descending colon, splenic flexure, transverse colon, hepatic flexure and       ascending colon.      A 12 mm polyp was found in the rectum. The polyp was  semi-sessile. The       polyp was removed with a hot snare. Resection and retrieval were       complete. Estimated blood loss was minimal.      The exam was otherwise without abnormality on direct and retroflexion       views. Impression:            - Diverticulosis in the sigmoid colon, in the                         descending colon, at the splenic flexure, in the                         transverse colon, at the hepatic flexure and in the                         ascending colon.                        - One 12 mm polyp in the rectum, removed with a hot                         snare. Resected and retrieved.                        - The examination was otherwise normal on direct and                         retroflexion views. Recommendation:        - Discharge patient to home.                        - Resume previous diet.                        - Continue present medications.                        - Await pathology results.                        -  Repeat colonoscopy for surveillance based on                         pathology results.                        - Return to referring physician as previously                         scheduled. Procedure Code(s):     --- Professional ---                        [phone], Colonoscopy, flexible; with removal of                         tumor(s), polyp(s), or other lesion(s) by snare                         technique Diagnosis Code(s):     --- Professional ---                        Z86.010, Personal history of colonic polyps                        K62.1, Rectal polyp                        K57.30, Diverticulosis of large intestine without                         perforation or abscess without bleeding CPT copyright 2019 [employer]. All rights reserved. The codes documented in this report are preliminary and upon coder review may  be revised to meet current compliance requirements. Andrey Farmer MD, MD 08/31/2020 9:43:44  AM Number of Addenda: 0 Note Initiated On: 08/31/2020 9:10 AM Scope Withdrawal Time: 0 hours 12 minutes 47 seconds  Total Procedure Duration: 0 hours 18 minutes 16 seconds  Estimated Blood Loss:  Estimated blood loss was minimal. Estimated blood loss                         was minimal.      [facility]

## 2020-09-01 ENCOUNTER — Encounter: Payer: Self-pay | Admitting: Gastroenterology

## 2020-09-01 LAB — SURGICAL PATHOLOGY
